# Patient Record
Sex: Male | Born: 1980 | Race: Black or African American | Hispanic: No | Smoking: Current some day smoker
Health system: Southern US, Community
[De-identification: ages and names within clinical notes are randomized; demographics above are authoritative.]

---

## 2020-12-30 ENCOUNTER — Ambulatory Visit (INDEPENDENT_AMBULATORY_CARE_PROVIDER_SITE_OTHER): Payer: Medicaid Other

## 2020-12-30 ENCOUNTER — Ambulatory Visit (HOSPITAL_COMMUNITY)
Admission: EM | Admit: 2020-12-30 | Discharge: 2020-12-30 | Disposition: A | Discharge status: Home / Self Care | Payer: Medicaid Other | Attending: Student | Admitting: Student

## 2020-12-30 ENCOUNTER — Encounter (HOSPITAL_COMMUNITY): Payer: Self-pay

## 2020-12-30 ENCOUNTER — Other Ambulatory Visit: Payer: Self-pay

## 2020-12-30 DIAGNOSIS — M25511 Pain in right shoulder: Secondary | ICD-10-CM | POA: Diagnosis not present

## 2020-12-30 DIAGNOSIS — S161XXA Strain of muscle, fascia and tendon at neck level, initial encounter: Secondary | ICD-10-CM

## 2020-12-30 DIAGNOSIS — S46911A Strain of unspecified muscle, fascia and tendon at shoulder and upper arm level, right arm, initial encounter: Secondary | ICD-10-CM

## 2020-12-30 DIAGNOSIS — G44209 Tension-type headache, unspecified, not intractable: Secondary | ICD-10-CM | POA: Diagnosis not present

## 2020-12-30 DIAGNOSIS — S134XXA Sprain of ligaments of cervical spine, initial encounter: Secondary | ICD-10-CM

## 2020-12-30 MED ORDER — NAPROXEN 500 MG PO TABS: 500.0000 mg | ORAL_TABLET | Freq: Two times a day (BID) | ORAL | 0 refills | Status: DC

## 2020-12-30 MED ORDER — TIZANIDINE HCL 2 MG PO CAPS: 2.0000 mg | ORAL_CAPSULE | Freq: Three times a day (TID) | ORAL | 0 refills | Status: DC
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
## 2020-12-30 NOTE — Discharge Instructions (Addendum)
For your neck pain, use the zanaflex and naproxen. Zanaflex (tizanidine) is a muscle relaxer that you can take up to 3x daily. This will help with muscular spasm and pain. It can make you drowsy so take when you don't have to drive or operate machinery. You can also take naproxen, up to 2x daily for pain and inflammation. Make sure to take this with food. You can also take tylenol up to 1000mg  3x daily. Make sure to avoid other NSAIDs like ibuprofen or alleve while taking naproxen.  For your shoulder- rest, ice/heat, shoulder sling as needed for pain. If your pain worsens or persists, call sports medicine doctor for follow-up. Information below.  For your headache- Get help right away (go to ED or call 911) if: You have: A very bad headache that is not helped by medicine. Trouble walking or weakness in your arms and legs. Clear or bloody fluid coming from your nose or ears. Changes in how you see (vision). A seizure. More confusion or more grumpy moods. Your symptoms get worse. You are sleepier than normal and have trouble staying awake. You lose your balance. The black centers of your eyes (pupils) change in size. Your speech is slurred. Your dizziness gets worse. You vomit.
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
## 2021-01-02 ENCOUNTER — Telehealth (HOSPITAL_COMMUNITY): Payer: Self-pay | Admitting: Emergency Medicine

## 2021-01-02 MED ORDER — TIZANIDINE HCL 2 MG PO CAPS: 2.0000 mg | ORAL_CAPSULE | Freq: Three times a day (TID) | ORAL | 0 refills | Status: DC

## 2021-01-02 NOTE — Telephone Encounter (Signed)
Prescription for TIzanidine is not covered by medicaid, patient asking for cheaper alternative.  Looked up Tizanidine on [facility] and transferring prescription to [facility]- patient verbalized understanding

## 2021-06-26 ENCOUNTER — Encounter (HOSPITAL_COMMUNITY): Payer: Self-pay

## 2021-06-26 ENCOUNTER — Ambulatory Visit (HOSPITAL_COMMUNITY)
Admission: EM | Admit: 2021-06-26 | Discharge: 2021-06-26 | Disposition: A | Discharge status: Home / Self Care | Payer: Medicaid Other | Attending: Emergency Medicine | Admitting: Emergency Medicine

## 2021-06-26 DIAGNOSIS — R42 Dizziness and giddiness: Secondary | ICD-10-CM | POA: Insufficient documentation

## 2021-06-26 DIAGNOSIS — Z20822 Contact with and (suspected) exposure to covid-19: Secondary | ICD-10-CM | POA: Diagnosis not present

## 2021-06-26 DIAGNOSIS — R519 Headache, unspecified: Secondary | ICD-10-CM | POA: Insufficient documentation

## 2021-06-26 DIAGNOSIS — F17210 Nicotine dependence, cigarettes, uncomplicated: Secondary | ICD-10-CM | POA: Diagnosis not present

## 2021-06-26 LAB — CBC
HCT: 42.5 % (ref 39.0–52.0)
Hemoglobin: 15.3 g/dL (ref 13.0–17.0)
MCH: 36.3 pg — ABNORMAL HIGH (ref 26.0–34.0)
MCHC: 36 g/dL (ref 30.0–36.0)
MCV: 100.7 fL — ABNORMAL HIGH (ref 80.0–100.0)
Platelets: 237 10*3/uL (ref 150–400)
RBC: 4.22 MIL/uL (ref 4.22–5.81)
RDW: 13.9 % (ref 11.5–15.5)
WBC: 3.6 10*3/uL — ABNORMAL LOW (ref 4.0–10.5)
nRBC: 0 % (ref 0.0–0.2)

## 2021-06-26 LAB — COMPREHENSIVE METABOLIC PANEL
ALT: 25 U/L (ref 0–44)
AST: 27 U/L (ref 15–41)
Albumin: 4 g/dL (ref 3.5–5.0)
Alkaline Phosphatase: 78 U/L (ref 38–126)
Anion gap: 5 (ref 5–15)
BUN: 12 mg/dL (ref 6–20)
CO2: 29 mmol/L (ref 22–32)
Calcium: 9.2 mg/dL (ref 8.9–10.3)
Chloride: 108 mmol/L (ref 98–111)
Creatinine, Ser: 1.08 mg/dL (ref 0.61–1.24)
GFR, Estimated: >60 mL/min (ref >60)
Glucose, Bld: 66 mg/dL — ABNORMAL LOW (ref 70–99)
Potassium: 4.3 mmol/L (ref 3.5–5.1)
Sodium: 142 mmol/L (ref 135–145)
Total Bilirubin: 0.9 mg/dL (ref 0.3–1.2)
Total Protein: 7 g/dL (ref 6.5–8.1)

## 2021-06-26 LAB — SARS CORONAVIRUS 2 (TAT 6-24 HRS): SARS Coronavirus 2: NEGATIVE

## 2021-06-26 LAB — TSH: TSH: 2.528 u[IU]/mL (ref 0.350–4.500)

## 2021-06-26 MED ORDER — MECLIZINE HCL 25 MG PO TABS
25.0000 mg | ORAL_TABLET | Freq: Three times a day (TID) | ORAL | 0 refills | Status: DC | PRN
Start: 2021-06-26 — End: 2023-09-24

## 2021-06-26 NOTE — Discharge Instructions (Addendum)
The cause of your symptoms are unknown at this time therefore lab work is being drawn to rule out causes  Lab work is pending 24 hours, you will be notified of any concerning values  Can use meclizine every 8 hours as needed for dizziness  Can use 600 to 800 mg of ibuprofen every 8 hours as needed to manage headaches  A primary care referral has been placed for you to help you establish care however you may find any doctor that you deem appropriate  Please measure blood pressure at least 2-3 times a week and record values to take to primary care appointment, if blood pressure is consistently elevated this will need to be managed by primary care physician in order to prevent long-term complications

## 2021-06-26 NOTE — ED Triage Notes (Signed)
Pt reports headache and room spinning when standing up x 1 week.

## 2021-06-26 NOTE — ED Provider Notes (Signed)
[facility]    CSN: [id number] Arrival date & time: 06/26/21  1035      History   Chief Complaint Chief Complaint  Patient presents with   Dizziness   Headache    HPI Justin Phelps is a 40 y.o. male.   Patient presents with dizziness and intermittent frontal headache for 1 week. Feels like he is moving, like he will faint or fall. Occurs with change of position and with sitting. Endorses adequate food and fluid intake. Denies fever, chills, congestion, chest pain or tightness, shortness of breath, intolerance to cold or heat, nausea, light sensitivity, blurred vision, floaters. Vaccinated. No pertinent medical history. Blood pressure elevated. Unsure if this is his baseline.   History reviewed. No pertinent past medical history.  There are no problems to display for this patient.   History reviewed. No pertinent surgical history.     Home Medications    Prior to Admission medications   Medication Sig Start Date End Date Taking? Authorizing Provider  meclizine (ANTIVERT) 25 MG tablet Take 1 tablet (25 mg total) by mouth 3 (three) times daily as needed for dizziness. 06/26/21  Yes Mariel Gaudin R, NP  naproxen (NAPROSYN) 500 MG tablet Take 1 tablet (500 mg total) by mouth 2 (two) times daily. 12/30/20   Rhys Martini, PA-C  tizanidine (ZANAFLEX) 2 MG capsule Take 1 capsule (2 mg total) by mouth 3 (three) times daily. 01/02/21   Lamptey, Britta Mccreedy, MD    Family History History reviewed. No pertinent family history.  Social History Social History   Tobacco Use   Smoking status: Some Days    Types: Cigarettes   Smokeless tobacco: Never  Substance Use Topics   Alcohol use: Yes   Drug use: Yes    Frequency: 3.0 times per week    Types: Marijuana     Allergies   Patient has no known allergies.   Review of Systems Review of Systems Defer to HPI  Physical Exam Triage Vital Signs ED Triage Vitals  Enc Vitals Group     BP 06/26/21 1127 (!) 147/100      Pulse Rate 06/26/21 1127 61     Resp 06/26/21 1127 18     Temp 06/26/21 1127 98.2 F (36.8 C)     Temp src --      SpO2 06/26/21 1127 100 %     Weight --      Height --      Head Circumference --      Peak Flow --      Pain Score 06/26/21 1125 7     Pain Loc --      Pain Edu? --      Excl. in GC? --    No data found.  Updated Vital Signs BP (!) 147/100 (BP Location: Right Arm)   Pulse 61   Temp 98.2 F (36.8 C)   Resp 18   SpO2 100%   Visual Acuity Right Eye Distance:   Left Eye Distance:   Bilateral Distance:    Right Eye Near:   Left Eye Near:    Bilateral Near:     Physical Exam Constitutional:      Appearance: Normal appearance. He is normal weight.  HENT:     Head: Normocephalic.     Right Ear: Tympanic membrane, ear canal and external ear normal.     Left Ear: Tympanic membrane, ear canal and external ear normal.     Nose: Nose normal.  Mouth/Throat:     Mouth: Mucous membranes are moist.     Pharynx: Oropharynx is clear.  Eyes:     Extraocular Movements: Extraocular movements intact.     Conjunctiva/sclera: Conjunctivae normal.     Pupils: Pupils are equal, round, and reactive to light.  Cardiovascular:     Rate and Rhythm: Normal rate and regular rhythm.     Pulses: Normal pulses.     Heart sounds: Normal heart sounds.  Pulmonary:     Effort: Pulmonary effort is normal.     Breath sounds: Normal breath sounds.  Musculoskeletal:        General: Normal range of motion.     Cervical back: Normal range of motion and neck supple.  Skin:    General: Skin is warm and dry.  Neurological:     General: No focal deficit present.     Mental Status: He is alert and oriented to person, place, and time. Mental status is at baseline.     Cranial Nerves: No cranial nerve deficit.     Sensory: No sensory deficit.     Motor: No weakness.     Coordination: Coordination normal.     Gait: Gait normal.  Psychiatric:        Mood and Affect: Mood normal.         Behavior: Behavior normal.     UC Treatments / Results  Labs (all labs ordered are listed, but only abnormal results are displayed) Labs Reviewed  SARS CORONAVIRUS 2 (TAT 6-24 HRS)  COMPREHENSIVE METABOLIC PANEL  CBC  TSH    EKG   Radiology No results found.  Procedures Procedures (including critical care time)  Medications Ordered in UC Medications - No data to display  Initial Impression / Assessment and Plan / UC Course  I have reviewed the triage vital signs and the nursing notes.  Pertinent labs & imaging results that were available during my care of the patient were reviewed by me and considered in my medical decision making (see chart for details).  Dizziness  Bad headache  Patient vital signs stable, neurological exam unremarkable, no signs of distress at this time, unknown etiology of symptoms, will move forward with conservative treatment attempting to rule out causes with advised  follow-up with primary care physician  CBC, TSH, CMP- pending Covid test pending PCP referral placed Advised to monitor blood pressure 2-3 times a week and record until seen by primary care doctor to establish baseline Meclizine 25 mg 3 times a day as needed Advised use of over-the-counter Tylenol and ibuprofen for headache management 7.  Patient given strict return precautions for worsening symptoms to return to urgent care for evaluation of unable to establish care Final Clinical Impressions(s) / UC Diagnoses   Final diagnoses:  Dizziness  Bad headache     Discharge Instructions      The cause of your symptoms are unknown at this time therefore lab work is being drawn to rule out causes  Lab work is pending 24 hours, you will be notified of any concerning values  Can use meclizine every 8 hours as needed for dizziness  Can use 600 to 800 mg of ibuprofen every 8 hours as needed to manage headaches  A primary care referral has been placed for you to help you  establish care however you may find any doctor that you deem appropriate  Please measure blood pressure at least 2-3 times a week and record values to take to primary care  appointment, if blood pressure is consistently elevated this will need to be managed by primary care physician in order to prevent long-term complications   ED Prescriptions     Medication Sig Dispense Auth. Provider   meclizine (ANTIVERT) 25 MG tablet Take 1 tablet (25 mg total) by mouth 3 (three) times daily as needed for dizziness. 30 tablet Valinda Hoar, NP      PDMP not reviewed this encounter.   Valinda Hoar, NP 06/26/21 1243

## 2022-05-07 ENCOUNTER — Other Ambulatory Visit: Payer: Self-pay

## 2022-05-07 ENCOUNTER — Ambulatory Visit
Admission: RE | Admit: 2022-05-07 | Discharge: 2022-05-07 | Disposition: A | Discharge status: Home / Self Care | Payer: Medicaid Other | Source: Ambulatory Visit | Attending: Physician Assistant | Admitting: Physician Assistant

## 2022-05-07 VITALS — BP 116/72 | HR 61 | Temp 97.6°F | Resp 18

## 2022-05-07 DIAGNOSIS — J0120 Acute ethmoidal sinusitis, unspecified: Secondary | ICD-10-CM

## 2022-05-07 MED ORDER — AMOXICILLIN 500 MG PO CAPS: 500.0000 mg | ORAL_CAPSULE | Freq: Three times a day (TID) | ORAL | 0 refills | Status: DC

## 2022-05-07 NOTE — ED Triage Notes (Addendum)
Headaches started one week ago.  Intermittent chills, slight runny nose and cough.  Head pain increases with bending forward and has some dizziness

## 2022-05-07 NOTE — Discharge Instructions (Addendum)
Return if any problems.

## 2022-05-12 NOTE — ED Provider Notes (Signed)
[facility]    CSN: [id number] Arrival date & time: 05/07/22  1200      History   Chief Complaint Chief Complaint  Patient presents with   Chills    Throbbing headache that wont go away  been like this for about a week now. Almost like a sinus headache. When i bend down i get real dizzy and light headed and my job requires me too do a lot of bending and squatting throughout the day. Im not really - Entered by patient   Appointment    12:00    HPI Justin Phelps is a 41 y.o. male.   Patient complains of a fever and chills patient complains of sinus pressure and congestion patient reports headache.  Patient is concerned that he may have a sinus infection.  Patient denies any shortness of breath  The history is provided by the patient. No language interpreter was used.    History reviewed. No pertinent past medical history.  There are no problems to display for this patient.   History reviewed. No pertinent surgical history.     Home Medications    Prior to Admission medications   Medication Sig Start Date End Date Taking? Authorizing Provider  amoxicillin (AMOXIL) 500 MG capsule Take 1 capsule (500 mg total) by mouth 3 (three) times daily. 05/07/22  Yes Elson Areas, PA-C  meclizine (ANTIVERT) 25 MG tablet Take 1 tablet (25 mg total) by mouth 3 (three) times daily as needed for dizziness. Patient not taking: Reported on 05/07/2022 06/26/21   Valinda Hoar, NP  naproxen (NAPROSYN) 500 MG tablet Take 1 tablet (500 mg total) by mouth 2 (two) times daily. Patient not taking: Reported on 05/07/2022 12/30/20   Rhys Martini, PA-C  tizanidine (ZANAFLEX) 2 MG capsule Take 1 capsule (2 mg total) by mouth 3 (three) times daily. Patient not taking: Reported on 05/07/2022 01/02/21   Merrilee Jansky, MD    Family History History reviewed. No pertinent family history.  Social History Social History   Tobacco Use   Smoking status: Some Days    Types:  Cigarettes   Smokeless tobacco: Never  Vaping Use   Vaping Use: Never used  Substance Use Topics   Alcohol use: Yes   Drug use: Yes    Frequency: 3.0 times per week    Types: Marijuana     Allergies   Patient has no known allergies.   Review of Systems Review of Systems  HENT:  Positive for congestion, facial swelling and sinus pain.   All other systems reviewed and are negative.    Physical Exam Triage Vital Signs ED Triage Vitals  Enc Vitals Group     BP 05/07/22 1213 116/72     Pulse Rate 05/07/22 1213 61     Resp 05/07/22 1213 18     Temp 05/07/22 1213 97.6 F (36.4 C)     Temp Source 05/07/22 1213 Oral     SpO2 05/07/22 1213 98 %     Weight --      Height --      Head Circumference --      Peak Flow --      Pain Score 05/07/22 1210 5     Pain Loc --      Pain Edu? --      Excl. in [place]? --    No data found.  Updated Vital Signs BP 116/72 (BP Location: Left Arm)   Pulse 61  Temp 97.6 F (36.4 C) (Oral)   Resp 18   SpO2 98%   Visual Acuity Right Eye Distance:   Left Eye Distance:   Bilateral Distance:    Right Eye Near:   Left Eye Near:    Bilateral Near:     Physical Exam Vitals and nursing note reviewed.  Constitutional:      Appearance: He is well-developed.  HENT:     Head: Normocephalic.  Cardiovascular:     Rate and Rhythm: Normal rate.  Pulmonary:     Effort: Pulmonary effort is normal.  Abdominal:     General: There is no distension.  Musculoskeletal:        General: Normal range of motion.     Cervical back: Normal range of motion.  Skin:    General: Skin is warm.  Neurological:     General: No focal deficit present.     Mental Status: He is alert and oriented to person, place, and time.  Psychiatric:        Mood and Affect: Mood normal.      UC Treatments / Results  Labs (all labs ordered are listed, but only abnormal results are displayed) Labs Reviewed - No data to display  EKG   Radiology No results  found.  Procedures Procedures (including critical care time)  Medications Ordered in UC Medications - No data to display  Initial Impression / Assessment and Plan / UC Course  I have reviewed the triage vital signs and the nursing notes.  Pertinent labs & imaging results that were available during my care of the patient were reviewed by me and considered in my medical decision making (see chart for details).     MDM: Counseled on sinusitis and a prescription for amoxicillin he is advised to follow-up with his primary care physician Final Clinical Impressions(s) / UC Diagnoses   Final diagnoses:  Acute ethmoidal sinusitis, recurrence not specified     Discharge Instructions      Return if any problems.    ED Prescriptions     Medication Sig Dispense Auth. Provider   amoxicillin (AMOXIL) 500 MG capsule Take 1 capsule (500 mg total) by mouth 3 (three) times daily. 30 capsule Elson Areas, [place]      PDMP not reviewed this encounter.   Elson Areas, [place] 05/12/22 2121

## 2022-07-01 ENCOUNTER — Ambulatory Visit
Admission: RE | Admit: 2022-07-01 | Discharge: 2022-07-01 | Disposition: A | Discharge status: Home / Self Care | Payer: Medicaid Other | Source: Ambulatory Visit | Attending: Emergency Medicine | Admitting: Emergency Medicine

## 2022-07-01 VITALS — BP 132/86 | HR 63 | Temp 98.3°F | Resp 16

## 2022-07-01 DIAGNOSIS — H6122 Impacted cerumen, left ear: Secondary | ICD-10-CM | POA: Diagnosis present

## 2022-07-01 DIAGNOSIS — J0100 Acute maxillary sinusitis, unspecified: Secondary | ICD-10-CM | POA: Diagnosis present

## 2022-07-01 DIAGNOSIS — Z20822 Contact with and (suspected) exposure to covid-19: Secondary | ICD-10-CM

## 2022-07-01 LAB — SARS CORONAVIRUS 2 BY RT PCR: SARS Coronavirus 2 by RT PCR: NEGATIVE

## 2022-07-01 MED ORDER — MOLNUPIRAVIR EUA 200MG CAPSULE
4.0000 | ORAL_CAPSULE | Freq: Two times a day (BID) | ORAL | 0 refills | Status: AC
Start: 2022-07-01 — End: 2022-07-06

## 2022-07-01 MED ORDER — FLUTICASONE PROPIONATE 50 MCG/ACT NA SUSP
2.0000 | Freq: Every day | NASAL | 0 refills | Status: DC
Start: 2022-07-01 — End: 2023-08-22

## 2022-07-01 NOTE — Discharge Instructions (Addendum)
Discontinue the molnupiravir if your COVID is negative.  Start Mucinex to keep the mucous thin, Flonase to help you breathe easier.   You may take 600 mg of motrin with 1000 mg of tylenol up to 3-4 times a day as needed for pain. This is an effective combination for pain.  Most sinus infections are viral and do not need antibiotics unless you have a high fever, have had this for 10 days, or you get better and then get sick again. Use a NeilMed sinus rinse with distilled water as often as you want to to reduce nasal congestion. Follow the directions on the box.   Go to www.goodrx.com to look up your medications. This will give you a list of where you can find your prescriptions at the most affordable prices. Or you can ask the pharmacist what the cash price is. This is frequently cheaper than going through insurance.

## 2022-07-01 NOTE — ED Triage Notes (Signed)
Pt. States that since Thursday he has been experiencing headaches, bodyaches and dizziness when he bends down.

## 2022-07-01 NOTE — ED Provider Notes (Signed)
HPI  SUBJECTIVE:  Justin Phelps is a 41 y.o. male who presents with 4 to 5 days of headache, body aches, sinus pressure, dizziness described as feeling off balance and lightheaded that is worse with bending over.  He reports some mild photophobia.  No ear pain, fevers, nasal congestion, rhinorrhea, sinus pain, sore throat, postnasal drip, loss of sense of smell or taste, cough, wheeze, shortness of breath, neck stiffness, rash, nausea, vomiting, diarrhea, abdominal pain.  No known COVID or flu exposure.  He got 1 dose of the COVID-vaccine.  He has had episodes of dizziness when he gets up from a sitting/squatting position rapidly for the past year.  He works in a hot environment, but states that he is eating and drinking well.  He has tried Excedrin with improvement in the symptoms.  Symptoms are worse with standing up rapidly and exposure to bright light.  He had a headache like this 2 months ago, and was found to have sinusitis.  He has a past medical history of headache.  No other medical problems.    History reviewed. No pertinent past medical history.  History reviewed. No pertinent surgical history.  History reviewed. No pertinent family history.  Social History   Tobacco Use   Smoking status: Some Days    Types: Cigarettes   Smokeless tobacco: Never  Vaping Use   Vaping Use: Never used  Substance Use Topics   Alcohol use: Yes   Drug use: Yes    Frequency: 3.0 times per week    Types: Marijuana    No current facility-administered medications for this encounter.  Current Outpatient Medications:    fluticasone (FLONASE) 50 MCG/ACT nasal spray, Place 2 sprays into both nostrils daily., Disp: 16 g, Rfl: 0   molnupiravir EUA (LAGEVRIO) 200 mg CAPS capsule, Take 4 capsules (800 mg total) by mouth 2 (two) times daily for 5 days., Disp: 40 capsule, Rfl: 0   amoxicillin (AMOXIL) 500 MG capsule, Take 1 capsule (500 mg total) by mouth 3 (three) times daily., Disp: 30 capsule, Rfl: 0    meclizine (ANTIVERT) 25 MG tablet, Take 1 tablet (25 mg total) by mouth 3 (three) times daily as needed for dizziness. (Patient not taking: Reported on 05/07/2022), Disp: 30 tablet, Rfl: 0   naproxen (NAPROSYN) 500 MG tablet, Take 1 tablet (500 mg total) by mouth 2 (two) times daily. (Patient not taking: Reported on 05/07/2022), Disp: 30 tablet, Rfl: 0   tizanidine (ZANAFLEX) 2 MG capsule, Take 1 capsule (2 mg total) by mouth 3 (three) times daily. (Patient not taking: Reported on 05/07/2022), Disp: 21 capsule, Rfl: 0  No Known Allergies   ROS  As noted in HPI.   Physical Exam  BP 132/86   Pulse 63   Temp 98.3 F (36.8 C)   Resp 16   SpO2 97%   Constitutional: Well developed, well nourished, no acute distress Eyes:  EOMI, conjunctiva normal bilaterally HENT: Normocephalic, atraumatic,mucus membranes moist.  Positive nasal congestion, maxillary sinus tenderness, erythematous, swollen turbinates.  Left TM obscured with cerumen.  Right TM normal. Neck: Positive shotty cervical lymphadenopathy Respiratory: Normal inspiratory effort Cardiovascular: Normal rate GI: nondistended skin: No rash, skin intact Musculoskeletal: no deformities Neurologic: Alert & oriented x 3, no focal neuro deficits Psychiatric: Speech and behavior appropriate   ED Course   Medications - No data to display  Orders Placed This Encounter  Procedures   SARS Coronavirus 2 by RT PCR (hospital order, performed in [facility] hospital lab) *cepheid  single result test* Anterior Nasal Swab    Standing Status:   Standing    Number of Occurrences:   1   Ear wax removal    Left ear only    Standing Status:   Standing    Number of Occurrences:   1    Results for orders placed or performed during the hospital encounter of 07/01/22 (from the past 24 hour(s))  SARS Coronavirus 2 by RT PCR (hospital order, performed in [facility] hospital lab) *cepheid single result test* Anterior Nasal Swab     Status: None    Collection Time: 07/01/22 12:17 PM   Specimen: Anterior Nasal Swab  Result Value Ref Range   SARS Coronavirus 2 by RT PCR NEGATIVE NEGATIVE   No results found.  ED Clinical Impression  1. Acute non-recurrent maxillary sinusitis   2. Encounter for laboratory testing for COVID-19 virus   3. Impacted cerumen of left ear      ED Assessment/Plan     1.  Viral illness.  Concern for COVID.  Today would be the last window to start him on antivirals.  He qualifies for antivirals due to partially vaccinated status.  Home with molnupiravir.  He will discontinue if his COVID is negative.  If his flu is positive, unfortunately, he is out of the treatment window for this.  He does have some maxillary sinus tenderness suggestive of an early sinusitis, however he does not meet criteria for antibiotics.  We will send him home with saline nasal irrigation, Flonase, Mucinex.  Return here in 5 days if the nasal congestion, sinus pressure is not any better , and we can consider antibiotics at that time.  COVID-negative.  He is to discontinue the molnupiravir.  Patient does not have MyChart.  Will have staff contact patient and notify him of negative result and that he needs to stop the molnupiravir and treat supportively.  2.  Dizziness.  He works in a hot environment.  Suspect that he has some dehydration versus lightheadedness from vasodilation as standing up rapidly.  Encouraged him to push electrolyte containing fluids.  He also has cerumen impaction in his left ear which could be contributing to this.   will have this irrigated and reevaluate.  On reevaluation, patient states that he feels better.  Left TM intact, normal.  Discussed labs, MDM, treatment plan, and plan for follow-up with patient. patient agrees with plan.   Meds ordered this encounter  Medications   molnupiravir EUA (LAGEVRIO) 200 mg CAPS capsule    Sig: Take 4 capsules (800 mg total) by mouth 2 (two) times daily for 5 days.     Dispense:  40 capsule    Refill:  0   fluticasone (FLONASE) 50 MCG/ACT nasal spray    Sig: Place 2 sprays into both nostrils daily.    Dispense:  16 g    Refill:  0      *This clinic note was created using Scientist, clinical (histocompatibility and immunogenetics). Therefore, there may be occasional mistakes despite careful proofreading.  ?    Domenick Gong, MD 07/02/22 [phone]

## 2022-07-26 ENCOUNTER — Ambulatory Visit
Admission: RE | Admit: 2022-07-26 | Discharge: 2022-07-26 | Disposition: A | Discharge status: Home / Self Care | Payer: Medicaid Other | Source: Ambulatory Visit | Attending: Internal Medicine | Admitting: Internal Medicine

## 2022-07-26 VITALS — BP 119/80 | HR 65 | Temp 97.7°F | Resp 16

## 2022-07-26 DIAGNOSIS — G43019 Migraine without aura, intractable, without status migrainosus: Secondary | ICD-10-CM | POA: Diagnosis not present

## 2022-07-26 MED ORDER — DEXAMETHASONE SODIUM PHOSPHATE 10 MG/ML IJ SOLN
10.0000 mg | Freq: Once | INTRAMUSCULAR | Status: AC
  Administered 2022-07-26: 10 mg via INTRAMUSCULAR

## 2022-07-26 MED ORDER — KETOROLAC TROMETHAMINE 30 MG/ML IJ SOLN
30.0000 mg | Freq: Once | INTRAMUSCULAR | Status: AC
  Administered 2022-07-26: 30 mg via INTRAMUSCULAR

## 2022-07-26 NOTE — Discharge Instructions (Signed)
You were given 2 shots in urgent care today to help decrease inflammation and pain.  Please go to the emergency department if symptoms persist or worsen.  Also follow-up with headache clinic for further evaluation management of possible migraines.

## 2022-07-26 NOTE — ED Provider Notes (Addendum)
[facility]    CSN: [id number] Arrival date & time: 07/26/22  1215      History   Chief Complaint Chief Complaint  Patient presents with   Headache    HPI Justin Phelps is a 41 y.o. male.   Patient presents with 3-day history of intermittent headaches.  Patient reports that headache is present in the center of her forehead and also in the occipital portion of the head.  They are intermittent.  He currently has a headache that he rates 8/10 on pain scale.  He reports that it is not the worst headache of his life.  Denies nasal congestion, cough, runny nose, nausea, vomiting, diarrhea, abdominal pain, fever.  Denies any known sick contacts.  Patient reports that headache worsens when he bends over.  He also reports some intermittent dizziness.  Denies any blurred vision.  Patient has been seen multiple times for similar headaches and was thought to have a sinus infection.  He reports that symptoms sometimes improve with medications that he has been prescribed for sinus infections.  Denies any recent falls or head trauma.  Patient denies any obvious sinus pressure but states that the headache feels like "sinus pressure" in the front of his head.   Headache   History reviewed. No pertinent past medical history.  There are no problems to display for this patient.   History reviewed. No pertinent surgical history.     Home Medications    Prior to Admission medications   Medication Sig Start Date End Date Taking? Authorizing Provider  amoxicillin (AMOXIL) 500 MG capsule Take 1 capsule (500 mg total) by mouth 3 (three) times daily. 05/07/22   Fransico Meadow, PA-C  fluticasone (FLONASE) 50 MCG/ACT nasal spray Place 2 sprays into both nostrils daily. 07/01/22   Melynda Ripple, MD  meclizine (ANTIVERT) 25 MG tablet Take 1 tablet (25 mg total) by mouth 3 (three) times daily as needed for dizziness. Patient not taking: Reported on 05/07/2022 06/26/21   Hans Eden, NP   naproxen (NAPROSYN) 500 MG tablet Take 1 tablet (500 mg total) by mouth 2 (two) times daily. Patient not taking: Reported on 05/07/2022 12/30/20   Hazel Sams, PA-C  tizanidine (ZANAFLEX) 2 MG capsule Take 1 capsule (2 mg total) by mouth 3 (three) times daily. Patient not taking: Reported on 05/07/2022 01/02/21   Chase Picket, MD    Family History History reviewed. No pertinent family history.  Social History Social History   Tobacco Use   Smoking status: Some Days    Types: Cigarettes   Smokeless tobacco: Never  Vaping Use   Vaping Use: Never used  Substance Use Topics   Alcohol use: Yes   Drug use: Yes    Frequency: 3.0 times per week    Types: Marijuana     Allergies   Patient has no known allergies.   Review of Systems Review of Systems Per HPI  Physical Exam Triage Vital Signs ED Triage Vitals  Enc Vitals Group     BP 07/26/22 1222 119/80     Pulse Rate 07/26/22 1222 65     Resp 07/26/22 1222 16     Temp 07/26/22 1222 97.7 F (36.5 C)     Temp Source 07/26/22 1222 Oral     SpO2 07/26/22 1222 96 %     Weight --      Height --      Head Circumference --      Peak Flow --  Pain Score 07/26/22 1228 8     Pain Loc --      Pain Edu? --      Excl. in [place]? --    No data found.  Updated Vital Signs BP 119/80 (BP Location: Left Arm)   Pulse 65   Temp 97.7 F (36.5 C) (Oral)   Resp 16   SpO2 96%   Visual Acuity Right Eye Distance:   Left Eye Distance:   Bilateral Distance:    Right Eye Near:   Left Eye Near:    Bilateral Near:     Physical Exam Constitutional:      General: He is not in acute distress.    Appearance: Normal appearance. He is not toxic-appearing or diaphoretic.  HENT:     Head: Normocephalic and atraumatic.     Right Ear: Tympanic membrane and ear canal normal.     Left Ear: Tympanic membrane and ear canal normal.     Nose: Nose normal.     Mouth/Throat:     Mouth: Mucous membranes are moist.     Pharynx: No  posterior oropharyngeal erythema.  Eyes:     Extraocular Movements: Extraocular movements intact.     Conjunctiva/sclera: Conjunctivae normal.     Pupils: Pupils are equal, round, and reactive to light.  Cardiovascular:     Rate and Rhythm: Normal rate and regular rhythm.     Pulses: Normal pulses.     Heart sounds: Normal heart sounds.  Pulmonary:     Effort: Pulmonary effort is normal. No respiratory distress.     Breath sounds: Normal breath sounds.  Abdominal:     General: Bowel sounds are normal. There is no distension.     Palpations: Abdomen is soft.     Tenderness: There is no abdominal tenderness.  Neurological:     General: No focal deficit present.     Mental Status: He is alert and oriented to person, place, and time. Mental status is at baseline.     Cranial Nerves: Cranial nerves 2-12 are intact.     Sensory: Sensation is intact.     Motor: Motor function is intact.     Coordination: Coordination is intact.     Gait: Gait is intact.  Psychiatric:        Mood and Affect: Mood normal.        Behavior: Behavior normal.        Thought Content: Thought content normal.        Judgment: Judgment normal.      UC Treatments / Results  Labs (all labs ordered are listed, but only abnormal results are displayed) Labs Reviewed - No data to display  EKG   Radiology No results found.  Procedures Procedures (including critical care time)  Medications Ordered in UC Medications  ketorolac (TORADOL) 30 MG/ML injection 30 mg (30 mg Intramuscular Given 07/26/22 1247)  dexamethasone (DECADRON) injection 10 mg (10 mg Intramuscular Given 07/26/22 1248)    Initial Impression / Assessment and Plan / UC Course  I have reviewed the triage vital signs and the nursing notes.  Pertinent labs & imaging results that were available during my care of the patient were reviewed by me and considered in my medical decision making (see chart for details).     Physical exam is fairly  benign.  Neuro exam is normal.  Differential diagnoses include sinus infection versus chronic migraine.  I am suspicious that patient's recent visits to the urgent care could be  from migraine given description of symptoms.  Although, patient could have viral sinusitis causing headache as well.  Will attempt to treat for both with IM Toradol and IM Decadron here in urgent care which should help alleviate headache and inflammation associated with sinus pressure/sinusitis.  Do not think any antibiotics are warranted given duration of symptoms and no signs of secondary bacterial infection on exam.  Patient was given headache clinic information to seek care for further evaluation and management of possible migraines.  Given that neuro exam is normal and patient has had similar symptoms in the past, do not think that emergent evaluation or imaging of the head is necessary at this time.  He was also advised to go to the emergency department if headache does not improve or if it worsens in the next 24 to 48 hours.  Patient verbalized understanding and was agreeable with plan. Final Clinical Impressions(s) / UC Diagnoses   Final diagnoses:  Intractable migraine without aura and without status migrainosus     Discharge Instructions      You were given 2 shots in urgent care today to help decrease inflammation and pain.  Please go to the emergency department if symptoms persist or worsen.  Also follow-up with headache clinic for further evaluation management of possible migraines.    ED Prescriptions   None    PDMP not reviewed this encounter.   Teodora Medici, [place] 07/26/22 [place], [place],  07/26/22 1333

## 2022-07-26 NOTE — ED Triage Notes (Signed)
3 days ago, began having headaches. Waxes and wanes in nature. Reports the headaches being in both the front and the back of his head. Cough, sneezing, bending over makes the headache worse. Light also makes the headaches worse. Denies nausea, vomiting, fever. Says he felt this way around 2 months ago, was told he had a sinus infection and believes he was prescribed an antibiotic. States whatever he took it did ease the headaches, but they never fully went away. Also reports occasional mild dizziness, denies loss of conscious or head trauma.

## 2022-08-23 ENCOUNTER — Ambulatory Visit (HOSPITAL_COMMUNITY)
Admission: RE | Admit: 2022-08-23 | Discharge: 2022-08-23 | Disposition: A | Discharge status: Home / Self Care | Payer: Medicaid Other | Source: Ambulatory Visit | Attending: Physician Assistant | Admitting: Physician Assistant

## 2022-08-23 ENCOUNTER — Encounter (HOSPITAL_COMMUNITY): Payer: Self-pay

## 2022-08-23 VITALS — BP 142/92 | HR 64 | Temp 98.7°F | Resp 16

## 2022-08-23 DIAGNOSIS — Z8616 Personal history of COVID-19: Secondary | ICD-10-CM | POA: Insufficient documentation

## 2022-08-23 DIAGNOSIS — R6883 Chills (without fever): Secondary | ICD-10-CM | POA: Diagnosis not present

## 2022-08-23 DIAGNOSIS — M545 Low back pain, unspecified: Secondary | ICD-10-CM | POA: Insufficient documentation

## 2022-08-23 DIAGNOSIS — R52 Pain, unspecified: Secondary | ICD-10-CM

## 2022-08-23 DIAGNOSIS — B349 Viral infection, unspecified: Secondary | ICD-10-CM | POA: Insufficient documentation

## 2022-08-23 DIAGNOSIS — F17210 Nicotine dependence, cigarettes, uncomplicated: Secondary | ICD-10-CM | POA: Diagnosis not present

## 2022-08-23 DIAGNOSIS — Z20822 Contact with and (suspected) exposure to covid-19: Secondary | ICD-10-CM | POA: Insufficient documentation

## 2022-08-23 LAB — POCT RAPID STREP A, ED / UC: Streptococcus, Group A Screen (Direct): NEGATIVE

## 2022-08-23 LAB — RESP PANEL BY RT-PCR (FLU A&B, COVID) ARPGX2
Influenza A by PCR: NEGATIVE
Influenza B by PCR: NEGATIVE
SARS Coronavirus 2 by RT PCR: NEGATIVE

## 2022-08-23 MED ORDER — NAPROXEN 500 MG PO TABS: 500.0000 mg | ORAL_TABLET | Freq: Two times a day (BID) | ORAL | 0 refills | Status: DC

## 2022-08-23 NOTE — ED Triage Notes (Signed)
Pt is here for back pain x4days

## 2022-08-23 NOTE — Discharge Instructions (Addendum)
Your strep test was negative.  We will contact you if you are positive for COVID or flu.  Use Naprosyn for body pain.  Do not take NSAIDs with this medication including aspirin, ibuprofen/Advil, naproxen/Aleve.  You can use Tylenol/acetaminophen as needed.  Make sure you are resting and drinking plenty of fluid.  If you develop any additional symptoms please return for reevaluation.  If you have any worsening symptoms including high fever not responding to medication, weakness, nausea/vomiting, cough, chest pain, shortness of breath you need to go to the emergency room.

## 2022-08-23 NOTE — ED Provider Notes (Signed)
[facility]    CSN: [id number] Arrival date & time: 08/23/22  1205      History   Chief Complaint Chief Complaint  Patient presents with   Back Pain    Entered by patient    HPI Justin Phelps is a 41 y.o. male.   Patient presents today with a several day history of widespread body aches.  Reports body ache pain is rated 8 on a 0-10 pain scale, generalized throughout his body but worse in his lower back, described as intense aching, no aggravating or alleviating factors notified.  He has tried Tylenol as well as Goody powders without improvement of symptoms.  Denies any known sick contacts but did travel a few weeks ago.  Reports occasional congestion but denies any significant symptoms.  Denies any sore throat, fever, nausea, vomiting, chest pain, shortness of breath, cough.  Denies any recent antibiotic or steroid use.  He is a smoker but denies any significant past medical history including COPD, asthma, diabetes, immunosuppression.  He has had COVID with last episode approximately 1.5 years ago.    History reviewed. No pertinent past medical history.  There are no problems to display for this patient.   History reviewed. No pertinent surgical history.     Home Medications    Prior to Admission medications   Medication Sig Start Date End Date Taking? Authorizing Provider  fluticasone (FLONASE) 50 MCG/ACT nasal spray Place 2 sprays into both nostrils daily. 07/01/22   Domenick Gong, MD  meclizine (ANTIVERT) 25 MG tablet Take 1 tablet (25 mg total) by mouth 3 (three) times daily as needed for dizziness. Patient not taking: Reported on 05/07/2022 06/26/21   Valinda Hoar, NP  naproxen (NAPROSYN) 500 MG tablet Take 1 tablet (500 mg total) by mouth 2 (two) times daily. 08/23/22   Jyl Chico, Noberto Retort, PA-C    Family History History reviewed. No pertinent family history.  Social History Social History   Tobacco Use   Smoking status: Some Days    Types:  Cigarettes   Smokeless tobacco: Never  Vaping Use   Vaping Use: Never used  Substance Use Topics   Alcohol use: Yes   Drug use: Yes    Frequency: 3.0 times per week    Types: Marijuana     Allergies   Patient has no known allergies.   Review of Systems Review of Systems  Constitutional:  Positive for activity change, chills and fatigue. Negative for appetite change and fever.  HENT:  Positive for congestion. Negative for sinus pressure, sneezing and sore throat.   Respiratory:  Negative for cough and shortness of breath.   Cardiovascular:  Negative for chest pain.  Gastrointestinal:  Negative for abdominal pain, diarrhea, nausea and vomiting.  Musculoskeletal:  Positive for arthralgias, back pain and myalgias.  Neurological:  Positive for headaches. Negative for dizziness and light-headedness.     Physical Exam Triage Vital Signs ED Triage Vitals  Enc Vitals Group     BP 08/23/22 1226 (!) 142/92     Pulse Rate 08/23/22 1226 64     Resp 08/23/22 1226 16     Temp 08/23/22 1226 98.7 F (37.1 C)     Temp Source 08/23/22 1226 Oral     SpO2 --      Weight --      Height --      Head Circumference --      Peak Flow --      Pain Score 08/23/22 1225 8  Pain Loc --      Pain Edu? --      Excl. in GC? --    No data found.  Updated Vital Signs BP (!) 142/92 (BP Location: Left Arm)   Pulse 64   Temp 98.7 F (37.1 C) (Oral)   Resp 16   Visual Acuity Right Eye Distance:   Left Eye Distance:   Bilateral Distance:    Right Eye Near:   Left Eye Near:    Bilateral Near:     Physical Exam Vitals reviewed.  Constitutional:      General: He is awake.     Appearance: Normal appearance. He is well-developed. He is not ill-appearing.     Comments: Very pleasant male appears stated age in no acute distress sitting comfortably in exam room  HENT:     Head: Normocephalic and atraumatic.     Right Ear: Tympanic membrane, ear canal and external ear normal. Tympanic  membrane is not erythematous or bulging.     Left Ear: Tympanic membrane, ear canal and external ear normal. Tympanic membrane is not erythematous or bulging.     Nose: Nose normal.     Mouth/Throat:     Pharynx: Uvula midline. Posterior oropharyngeal erythema present. No oropharyngeal exudate.  Cardiovascular:     Rate and Rhythm: Normal rate and regular rhythm.     Heart sounds: Normal heart sounds, S1 normal and S2 normal. No murmur heard. Pulmonary:     Effort: Pulmonary effort is normal. No accessory muscle usage or respiratory distress.     Breath sounds: Normal breath sounds. No stridor. No wheezing, rhonchi or rales.     Comments: Clear to auscultation bilaterally Abdominal:     General: Bowel sounds are normal.     Palpations: Abdomen is soft.     Tenderness: There is no abdominal tenderness.  Lymphadenopathy:     Head:     Right side of head: No submental, submandibular or tonsillar adenopathy.     Left side of head: No submental, submandibular or tonsillar adenopathy.     Cervical: No cervical adenopathy.  Neurological:     Mental Status: He is alert.  Psychiatric:        Behavior: Behavior is cooperative.      UC Treatments / Results  Labs (all labs ordered are listed, but only abnormal results are displayed) Labs Reviewed  CULTURE, GROUP A STREP ([facility])  RESP PANEL BY RT-PCR (FLU A&B, COVID) [id number]  POCT RAPID STREP A, ED / UC    EKG   Radiology No results found.  Procedures Procedures (including critical care time)  Medications Ordered in UC Medications - No data to display  Initial Impression / Assessment and Plan / UC Course  I have reviewed the triage vital signs and the nursing notes.  Pertinent labs & imaging results that were available during my care of the patient were reviewed by me and considered in my medical decision making (see chart for details).     Discussed likely viral etiology of symptoms.  Patient is well-appearing, afebrile,  nontoxic, nontachycardic.  No evidence of acute infection on physical exam.  Strep testing was obtained given erythema in throat with clinical presentation and was negative.  Will defer antibiotics until culture results are available.  Flu/COVID testing was obtained-results pending.  Patient is outside the window of effectiveness for Tamiflu and has no significant past medical history that would warrant Paxlovid or other antivirals for COVID.  Recommended he use over-the-counter  medications.  He was prescribed Naprosyn for body pain with instructions to take NSAIDs with this medication.  He can use acetaminophen/Tylenol for pain relief.  Discussed that he is to rest and drink plenty of fluid.  If he has any worsening symptoms he needs to be seen immediately.  If his symptoms no improvement next week he is to return or see primary care.  Discussed that if he has chest pain, shortness of breath, nausea/vomiting, weakness, severe cough, fever not respond to medication he should be seen immediately.  Work excuse note with current CDC return to work guidelines based on COVID test result provided during visit today.  Final Clinical Impressions(s) / UC Diagnoses   Final diagnoses:  Viral illness  Body aches  Chills     Discharge Instructions      Your strep test was negative.  We will contact you if you are positive for COVID or flu.  Use Naprosyn for body pain.  Do not take NSAIDs with this medication including aspirin, ibuprofen/Advil, naproxen/Aleve.  You can use Tylenol/acetaminophen as needed.  Make sure you are resting and drinking plenty of fluid.  If you develop any additional symptoms please return for reevaluation.  If you have any worsening symptoms including high fever not responding to medication, weakness, nausea/vomiting, cough, chest pain, shortness of breath you need to go to the emergency room.     ED Prescriptions     Medication Sig Dispense Auth. Provider   naproxen (NAPROSYN) 500  MG tablet Take 1 tablet (500 mg total) by mouth 2 (two) times daily. 30 tablet Vadhir Mcnay, Derry Skill, PA-C      PDMP not reviewed this encounter.   Terrilee Croak, PA-C 08/23/22 1314

## 2022-08-26 LAB — CULTURE, GROUP A STREP (THRC)
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
## 2022-09-19 ENCOUNTER — Ambulatory Visit: Payer: Self-pay

## 2022-09-20 ENCOUNTER — Ambulatory Visit (HOSPITAL_COMMUNITY)
Admission: RE | Admit: 2022-09-20 | Discharge: 2022-09-20 | Disposition: A | Discharge status: Home / Self Care | Payer: Medicaid Other | Source: Ambulatory Visit

## 2022-09-27 ENCOUNTER — Ambulatory Visit: Payer: Self-pay

## 2022-10-11 ENCOUNTER — Ambulatory Visit
Admission: RE | Admit: 2022-10-11 | Discharge: 2022-10-11 | Disposition: A | Discharge status: Home / Self Care | Payer: Medicaid Other | Source: Ambulatory Visit | Attending: Urgent Care | Admitting: Urgent Care

## 2022-10-11 ENCOUNTER — Ambulatory Visit (INDEPENDENT_AMBULATORY_CARE_PROVIDER_SITE_OTHER): Payer: Medicaid Other

## 2022-10-11 ENCOUNTER — Ambulatory Visit: Payer: Medicaid Other

## 2022-10-11 VITALS — BP 141/94 | HR 98 | Temp 98.9°F | Resp 16

## 2022-10-11 DIAGNOSIS — M25531 Pain in right wrist: Secondary | ICD-10-CM

## 2022-10-11 DIAGNOSIS — M19041 Primary osteoarthritis, right hand: Secondary | ICD-10-CM

## 2022-10-11 DIAGNOSIS — S60211A Contusion of right wrist, initial encounter: Secondary | ICD-10-CM | POA: Diagnosis not present

## 2022-10-11 MED ORDER — PREDNISONE 20 MG PO TABS: ORAL_TABLET | ORAL | 0 refills | Status: DC
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
## 2022-10-11 NOTE — ED Triage Notes (Addendum)
Pt states he injured right hand/wrist yesterday while trying to prevent self from falling-NAD-steady gait

## 2023-03-27 ENCOUNTER — Ambulatory Visit: Payer: Self-pay

## 2023-03-27 ENCOUNTER — Ambulatory Visit
Admission: RE | Admit: 2023-03-27 | Discharge: 2023-03-27 | Disposition: A | Discharge status: Home / Self Care | Payer: Medicaid Other | Source: Ambulatory Visit | Attending: Nurse Practitioner | Admitting: Nurse Practitioner

## 2023-03-27 VITALS — BP 138/93 | HR 61 | Temp 98.1°F | Resp 16

## 2023-03-27 DIAGNOSIS — J029 Acute pharyngitis, unspecified: Secondary | ICD-10-CM | POA: Diagnosis present

## 2023-03-27 DIAGNOSIS — Z20818 Contact with and (suspected) exposure to other bacterial communicable diseases: Secondary | ICD-10-CM | POA: Diagnosis present

## 2023-03-27 LAB — POCT RAPID STREP A (OFFICE): Rapid Strep A Screen: NEGATIVE

## 2023-03-27 MED ORDER — AMOXICILLIN 500 MG PO CAPS: 500.0000 mg | ORAL_CAPSULE | Freq: Two times a day (BID) | ORAL | 0 refills | Status: AC

## 2023-03-27 NOTE — ED Provider Notes (Signed)
[facility]    CSN: [id number] Arrival date & time: 03/27/23  1349      History   Chief Complaint Chief Complaint  Patient presents with   Sore Throat    Entered by patient    HPI Justin Phelps is a 42 y.o. male  presents for evaluation of URI symptoms for 2 days. Patient reports associated symptoms of sore throat, subjective fevers, dry cough. Denies N/V/D, ear pain, body aches, shortness of breath. Patient does not have a hx of asthma.  He does smoke.  Reports that his children have strep throat.  Pt has taken nothing OTC for symptoms. Pt has no other concerns at this time.    Sore Throat    History reviewed. No pertinent past medical history.  There are no problems to display for this patient.   History reviewed. No pertinent surgical history.     Home Medications    Prior to Admission medications   Medication Sig Start Date End Date Taking? Authorizing Provider  amoxicillin (AMOXIL) 500 MG capsule Take 1 capsule (500 mg total) by mouth 2 (two) times daily for 10 days. 03/27/23 04/06/23 Yes Radford Pax, NP  fluticasone (FLONASE) 50 MCG/ACT nasal spray Place 2 sprays into both nostrils daily. 07/01/22   Domenick Gong, MD  meclizine (ANTIVERT) 25 MG tablet Take 1 tablet (25 mg total) by mouth 3 (three) times daily as needed for dizziness. Patient not taking: Reported on 05/07/2022 06/26/21   Valinda Hoar, NP  naproxen (NAPROSYN) 500 MG tablet Take 1 tablet (500 mg total) by mouth 2 (two) times daily. 08/23/22   Raspet, Noberto Retort, PA-C  predniSONE (DELTASONE) 20 MG tablet Take 2 tablets daily with breakfast. 10/11/22   Wallis Bamberg, PA-C    Family History History reviewed. No pertinent family history.  Social History Social History   Tobacco Use   Smoking status: Some Days    Types: Cigarettes   Smokeless tobacco: Never  Vaping Use   Vaping Use: Never used  Substance Use Topics   Alcohol use: Yes    Comment: occ   Drug use: Yes    Types:  Marijuana     Allergies   Patient has no known allergies.   Review of Systems Review of Systems  Constitutional:  Positive for chills.  HENT:  Positive for sore throat.   Respiratory:  Positive for cough.      Physical Exam Triage Vital Signs ED Triage Vitals  Enc Vitals Group     BP 03/27/23 1400 (!) 138/93     Pulse Rate 03/27/23 1400 61     Resp 03/27/23 1400 16     Temp 03/27/23 1400 98.1 F (36.7 C)     Temp Source 03/27/23 1400 Oral     SpO2 03/27/23 1400 97 %     Weight --      Height --      Head Circumference --      Peak Flow --      Pain Score 03/27/23 1419 1     Pain Loc --      Pain Edu? --      Excl. in GC? --    No data found.  Updated Vital Signs BP (!) 138/93 (BP Location: Left Arm)   Pulse 61   Temp 98.1 F (36.7 C) (Oral)   Resp 16   SpO2 97%   Visual Acuity Right Eye Distance:   Left Eye Distance:   Bilateral Distance:  Right Eye Near:   Left Eye Near:    Bilateral Near:     Physical Exam Vitals and nursing note reviewed.  Constitutional:      General: He is not in acute distress.    Appearance: Normal appearance. He is not ill-appearing or toxic-appearing.  HENT:     Head: Normocephalic and atraumatic.     Right Ear: Tympanic membrane and ear canal normal.     Left Ear: Tympanic membrane and ear canal normal.     Nose: No congestion.     Mouth/Throat:     Mouth: Mucous membranes are moist.     Pharynx: Posterior oropharyngeal erythema present.  Eyes:     Pupils: Pupils are equal, round, and reactive to light.  Cardiovascular:     Rate and Rhythm: Normal rate and regular rhythm.     Heart sounds: Normal heart sounds.  Pulmonary:     Effort: Pulmonary effort is normal.     Breath sounds: Normal breath sounds.  Musculoskeletal:     Cervical back: Normal range of motion and neck supple.  Lymphadenopathy:     Cervical: No cervical adenopathy.  Skin:    General: Skin is warm and dry.  Neurological:     General: No  focal deficit present.     Mental Status: He is alert and oriented to person, place, and time.  Psychiatric:        Mood and Affect: Mood normal.        Behavior: Behavior normal.      UC Treatments / Results  Labs (all labs ordered are listed, but only abnormal results are displayed) Labs Reviewed  CULTURE, GROUP A STREP [facility])  POCT RAPID STREP A (OFFICE)    EKG   Radiology No results found.  Procedures Procedures (including critical care time)  Medications Ordered in UC Medications - No data to display  Initial Impression / Assessment and Plan / UC Course  I have reviewed the triage vital signs and the nursing notes.  Pertinent labs & imaging results that were available during my care of the patient were reviewed by me and considered in my medical decision making (see chart for details).     Negative rapid strep, will culture.  Given close exposure and symptoms start amoxicillin Salt water gargles and warm liquids Rest and fluids PCP follow-up if symptoms do not improve ER precautions reviewed and patient verbalized understanding Final Clinical Impressions(s) / UC Diagnoses   Final diagnoses:  Sore throat  Exposure to strep throat     Discharge Instructions      The clinic will contact you with results of the strep throat culture done today.  Start amoxicillin twice daily for 10 days given your exposure to strep throat Salt water gargles and warm liquids such as tea and honey Rest and fluids PCP follow-up if symptoms do not improve Please go to the ER for any worsening symptoms Hope you feel better soon!   ED Prescriptions     Medication Sig Dispense Auth. Provider   amoxicillin (AMOXIL) 500 MG capsule Take 1 capsule (500 mg total) by mouth 2 (two) times daily for 10 days. 20 capsule Radford Pax, NP      PDMP not reviewed this encounter.   Radford Pax, NP 03/27/23 1425

## 2023-03-27 NOTE — Discharge Instructions (Signed)
The clinic will contact you with results of the strep throat culture done today.  Start amoxicillin twice daily for 10 days given your exposure to strep throat Salt water gargles and warm liquids such as tea and honey Rest and fluids PCP follow-up if symptoms do not improve Please go to the ER for any worsening symptoms Hope you feel better soon!

## 2023-03-27 NOTE — ED Triage Notes (Signed)
Pt presents to UC w/ c/o sore throat, fever, dry cough x2 days. Pt's children have strep.

## 2023-03-30 LAB — CULTURE, GROUP A STREP (THRC)

## 2023-08-11 ENCOUNTER — Ambulatory Visit
Admission: EM | Admit: 2023-08-11 | Discharge: 2023-08-11 | Disposition: A | Discharge status: Home / Self Care | Payer: 59 | Attending: Family Medicine | Admitting: Family Medicine

## 2023-08-11 ENCOUNTER — Encounter: Payer: Self-pay | Admitting: Emergency Medicine

## 2023-08-11 DIAGNOSIS — J029 Acute pharyngitis, unspecified: Secondary | ICD-10-CM | POA: Diagnosis present

## 2023-08-11 LAB — POCT RAPID STREP A (OFFICE): Rapid Strep A Screen: NEGATIVE

## 2023-08-11 NOTE — ED Provider Notes (Signed)
[facility]    CSN: [id number] Arrival date & time: 08/11/23  0912      History   Chief Complaint Chief Complaint  Patient presents with   Sore Throat    HPI Justin Phelps is a 42 y.o. male.    Sore Throat  Patient is here for sore throat, headache, body aches.  He noted it yesterday am.  Burgess Estelle he had a slight temp, felt warm.  99.2.  +runny nose, congestion and cough that started later last night.  No n/v.  He did take motrin and theraflu.  His son had a virus.        History reviewed. No pertinent past medical history.  There are no problems to display for this patient.   History reviewed. No pertinent surgical history.     Home Medications    Prior to Admission medications   Medication Sig Start Date End Date Taking? Authorizing Provider  fluticasone (FLONASE) 50 MCG/ACT nasal spray Place 2 sprays into both nostrils daily. 07/01/22   Domenick Gong, MD  meclizine (ANTIVERT) 25 MG tablet Take 1 tablet (25 mg total) by mouth 3 (three) times daily as needed for dizziness. Patient not taking: Reported on 05/07/2022 06/26/21   Valinda Hoar, NP  naproxen (NAPROSYN) 500 MG tablet Take 1 tablet (500 mg total) by mouth 2 (two) times daily. 08/23/22   Raspet, Noberto Retort, PA-C  predniSONE (DELTASONE) 20 MG tablet Take 2 tablets daily with breakfast. 10/11/22   Wallis Bamberg, PA-C    Family History History reviewed. No pertinent family history.  Social History Social History   Tobacco Use   Smoking status: Some Days    Types: Cigarettes   Smokeless tobacco: Never  Vaping Use   Vaping status: Never Used  Substance Use Topics   Alcohol use: Yes    Comment: occ   Drug use: Yes    Types: Marijuana     Allergies   Patient has no known allergies.   Review of Systems Review of Systems  Constitutional:  Positive for fever.  HENT:  Positive for congestion, rhinorrhea and sore throat.   Respiratory:  Positive for cough.   Cardiovascular:  Negative.   Gastrointestinal: Negative.   Musculoskeletal: Negative.   Psychiatric/Behavioral: Negative.       Physical Exam Triage Vital Signs ED Triage Vitals  Encounter Vitals Group     BP 08/11/23 0920 (!) 151/94     Systolic BP Percentile --      Diastolic BP Percentile --      Pulse Rate 08/11/23 0920 82     Resp 08/11/23 0920 16     Temp 08/11/23 0920 98.1 F (36.7 C)     Temp Source 08/11/23 0920 Oral     SpO2 08/11/23 0920 98 %     Weight 08/11/23 0921 150 lb (68 kg)     Height 08/11/23 0921 5\' 10"  (1.778 m)     Head Circumference --      Peak Flow --      Pain Score 08/11/23 0921 8     Pain Loc --      Pain Education --      Exclude from Growth Chart --    No data found.  Updated Vital Signs BP (!) 151/94 (BP Location: Right Arm)   Pulse 82   Temp 98.1 F (36.7 C) (Oral)   Resp 16   Ht 5\' 10"  (1.778 m)   Wt 68 kg   SpO2 98%  BMI 21.52 kg/m   Visual Acuity Right Eye Distance:   Left Eye Distance:   Bilateral Distance:    Right Eye Near:   Left Eye Near:    Bilateral Near:     Physical Exam Constitutional:      Appearance: He is well-developed.  HENT:     Nose: Congestion and rhinorrhea present.     Mouth/Throat:     Mouth: Mucous membranes are moist.     Pharynx: Posterior oropharyngeal erythema present. No oropharyngeal exudate.  Cardiovascular:     Rate and Rhythm: Normal rate and regular rhythm.     Heart sounds: Normal heart sounds.  Musculoskeletal:     Cervical back: Normal range of motion and neck supple.  Skin:    General: Skin is warm.  Neurological:     General: No focal deficit present.     Mental Status: He is alert.  Psychiatric:        Mood and Affect: Mood normal.      UC Treatments / Results  Labs (all labs ordered are listed, but only abnormal results are displayed) Labs Reviewed  CULTURE, GROUP A STREP [facility])  POCT RAPID STREP A (OFFICE)    EKG   Radiology No results found.  Procedures Procedures  (including critical care time)  Medications Ordered in UC Medications - No data to display  Initial Impression / Assessment and Plan / UC Course  I have reviewed the triage vital signs and the nursing notes.  Pertinent labs & imaging results that were available during my care of the patient were reviewed by me and considered in my medical decision making (see chart for details).     Final Clinical Impressions(s) / UC Diagnoses   Final diagnoses:  Acute pharyngitis, unspecified etiology     Discharge Instructions      You were seen today for sore throat and upper respiratory symptoms.  Your rapid strep was negative, but will be sent to the lab for culture.  In the mean time your symptoms appear viral.  I recommend over the counter motrin/tylenol for pain.  I recommend warm salt water gargles, honey, tea.  Please return if not improving as expected.     ED Prescriptions   None    PDMP not reviewed this encounter.   Jannifer Franklin, MD 08/11/23 [phone]

## 2023-08-11 NOTE — Discharge Instructions (Addendum)
You were seen today for sore throat and upper respiratory symptoms.  Your rapid strep was negative, but will be sent to the lab for culture.  In the mean time your symptoms appear viral.  I recommend over the counter motrin/tylenol for pain.  I recommend warm salt water gargles, honey, tea.  Please return if not improving as expected.

## 2023-08-11 NOTE — ED Triage Notes (Signed)
Patient c/o sore throat, bodyaches and headache x 1 day.  Patient has taken Ibuprofen and Theraflu for sx's.

## 2023-08-14 LAB — CULTURE, GROUP A STREP (THRC)

## 2023-08-22 ENCOUNTER — Other Ambulatory Visit: Payer: Self-pay

## 2023-08-22 ENCOUNTER — Ambulatory Visit
Admission: EM | Admit: 2023-08-22 | Discharge: 2023-08-22 | Disposition: A | Discharge status: Home / Self Care | Payer: 59 | Attending: Internal Medicine | Admitting: Internal Medicine

## 2023-08-22 ENCOUNTER — Encounter: Payer: Self-pay | Admitting: *Deleted

## 2023-08-22 DIAGNOSIS — J069 Acute upper respiratory infection, unspecified: Secondary | ICD-10-CM

## 2023-08-22 MED ORDER — FLUTICASONE PROPIONATE 50 MCG/ACT NA SUSP: 1.0000 | Freq: Every day | NASAL | 0 refills | Status: DC

## 2023-08-22 MED ORDER — BENZONATATE 100 MG PO CAPS: 100.0000 mg | ORAL_CAPSULE | Freq: Three times a day (TID) | ORAL | 0 refills | Status: DC | PRN

## 2023-08-22 NOTE — ED Triage Notes (Signed)
Productive cough x 3 days with congestion. States his son has pneumonia

## 2023-08-22 NOTE — ED Provider Notes (Signed)
[facility]    CSN: [id number] Arrival date & time: 08/22/23  1124      History   Chief Complaint Chief Complaint  Patient presents with   Cough    HPI Justin Phelps is a 42 y.o. male.   Patient presents with cough and nasal congestion that started about 3 days ago.  Reports that his son was recently diagnosed with pneumonia.  Patient was also seen in urgent care on 10/21 for different symptoms and reports that those symptoms resolved, and these are new symptoms.  Denies any known fever at home.  Denies chest pain or shortness of breath.  Patient has not taken any medication for symptoms.  Denies history of asthma or COPD but patient does report that he smokes approximately 4 cigarettes a day.   Cough   No past medical history on file.  There are no problems to display for this patient.   No past surgical history on file.     Home Medications    Prior to Admission medications   Medication Sig Start Date End Date Taking? Authorizing Provider  benzonatate (TESSALON) 100 MG capsule Take 1 capsule (100 mg total) by mouth every 8 (eight) hours as needed for cough. 08/22/23  Yes Jamita Mckelvin, Rolly Salter E, FNP  fluticasone (FLONASE) 50 MCG/ACT nasal spray Place 1 spray into both nostrils daily. 08/22/23  Yes Paulino Cork, Acie Fredrickson, FNP  meclizine (ANTIVERT) 25 MG tablet Take 1 tablet (25 mg total) by mouth 3 (three) times daily as needed for dizziness. Patient not taking: Reported on 05/07/2022 06/26/21   Valinda Hoar, NP  naproxen (NAPROSYN) 500 MG tablet Take 1 tablet (500 mg total) by mouth 2 (two) times daily. 08/23/22   Raspet, Noberto Retort, PA-C  predniSONE (DELTASONE) 20 MG tablet Take 2 tablets daily with breakfast. 10/11/22   Wallis Bamberg, PA-C    Family History No family history on file.  Social History Social History   Tobacco Use   Smoking status: Some Days    Types: Cigarettes   Smokeless tobacco: Never  Vaping Use   Vaping status: Never Used  Substance Use Topics    Alcohol use: Yes    Comment: social   Drug use: Not Currently    Types: Marijuana     Allergies   Patient has no known allergies.   Review of Systems Review of Systems Per HPI  Physical Exam Triage Vital Signs ED Triage Vitals  Encounter Vitals Group     BP 08/22/23 1320 (!) 137/92     Systolic BP Percentile --      Diastolic BP Percentile --      Pulse Rate 08/22/23 1320 68     Resp 08/22/23 1320 16     Temp 08/22/23 1320 98.7 F (37.1 C)     Temp Source 08/22/23 1320 Oral     SpO2 08/22/23 1320 99 %     Weight --      Height --      Head Circumference --      Peak Flow --      Pain Score 08/22/23 1318 8     Pain Loc --      Pain Education --      Exclude from Growth Chart --    No data found.  Updated Vital Signs BP (!) 137/92 (BP Location: Left Arm)   Pulse 68   Temp 98.7 F (37.1 C) (Oral)   Resp 16   SpO2 99%   Visual  Acuity Right Eye Distance:   Left Eye Distance:   Bilateral Distance:    Right Eye Near:   Left Eye Near:    Bilateral Near:     Physical Exam Constitutional:      General: He is not in acute distress.    Appearance: Normal appearance. He is not toxic-appearing or diaphoretic.  HENT:     Head: Normocephalic and atraumatic.     Right Ear: Tympanic membrane and ear canal normal.     Left Ear: Tympanic membrane and ear canal normal.     Nose: Congestion present.     Mouth/Throat:     Mouth: Mucous membranes are moist.     Pharynx: No posterior oropharyngeal erythema.  Eyes:     Extraocular Movements: Extraocular movements intact.     Conjunctiva/sclera: Conjunctivae normal.     Pupils: Pupils are equal, round, and reactive to light.  Cardiovascular:     Rate and Rhythm: Normal rate and regular rhythm.     Pulses: Normal pulses.     Heart sounds: Normal heart sounds.  Pulmonary:     Effort: Pulmonary effort is normal. No respiratory distress.     Breath sounds: Normal breath sounds. No stridor. No wheezing, rhonchi or  rales.  Abdominal:     General: Abdomen is flat. Bowel sounds are normal.     Palpations: Abdomen is soft.  Musculoskeletal:        General: Normal range of motion.     Cervical back: Normal range of motion.  Skin:    General: Skin is warm and dry.  Neurological:     General: No focal deficit present.     Mental Status: He is alert and oriented to person, place, and time. Mental status is at baseline.  Psychiatric:        Mood and Affect: Mood normal.        Behavior: Behavior normal.      UC Treatments / Results  Labs (all labs ordered are listed, but only abnormal results are displayed) Labs Reviewed - No data to display  EKG   Radiology No results found.  Procedures Procedures (including critical care time)  Medications Ordered in UC Medications - No data to display  Initial Impression / Assessment and Plan / UC Course  I have reviewed the triage vital signs and the nursing notes.  Pertinent labs & imaging results that were available during my care of the patient were reviewed by me and considered in my medical decision making (see chart for details).     Patient presents with symptoms likely from a viral upper respiratory infection.  Do not suspect underlying cardiopulmonary process. Symptoms seem unlikely related to ACS, CHF or COPD exacerbations, pneumonia, pneumothorax. Patient is nontoxic appearing and not in need of emergent medical intervention.  I have a very low suspicion for pneumonia given vital signs are stable with no fever and there are no adventitious lung sounds on exam.  Therefore, chest imaging was deferred today.  Patient declined COVID testing.  No indication of antibiotic therapy noted on physical exam.  Recommended symptom control with medications, supportive care, fluids, rest.  Patient was sent Flonase and benzonatate for symptoms.  Return if symptoms fail to improve in 1-2 weeks or you develop shortness of breath, chest pain, severe headache.  Patient states understanding and is agreeable.  Discharged with PCP followup.  Final Clinical Impressions(s) / UC Diagnoses   Final diagnoses:  Viral upper respiratory tract infection with cough  Discharge Instructions      As we discussed, your symptoms appear viral in nature. I have prescribed you cough medication and nasal spray to help alleviate symptoms.  Ensure you are drinking plenty of fluids.  You may follow-up if any symptoms persist or worsen.    ED Prescriptions     Medication Sig Dispense Auth. Provider   fluticasone (FLONASE) 50 MCG/ACT nasal spray Place 1 spray into both nostrils daily. 16 g Kristena Wilhelmi, Rolly Salter E, [place]   benzonatate (TESSALON) 100 MG capsule Take 1 capsule (100 mg total) by mouth every 8 (eight) hours as needed for cough. 21 capsule Bixby, Acie Fredrickson, [place]      PDMP not reviewed this encounter.   Gustavus Bryant, [place] 08/22/23 1350

## 2023-08-22 NOTE — Discharge Instructions (Signed)
As we discussed, your symptoms appear viral in nature. I have prescribed you cough medication and nasal spray to help alleviate symptoms.  Ensure you are drinking plenty of fluids.  You may follow-up if any symptoms persist or worsen.

## 2023-09-24 ENCOUNTER — Ambulatory Visit
Admission: RE | Admit: 2023-09-24 | Discharge: 2023-09-24 | Disposition: A | Discharge status: Home / Self Care | Payer: 59 | Source: Ambulatory Visit | Attending: Family Medicine | Admitting: Family Medicine

## 2023-09-24 VITALS — BP 143/83 | HR 88 | Temp 97.8°F | Resp 18 | Ht 70.5 in | Wt 151.0 lb

## 2023-09-24 DIAGNOSIS — Z20822 Contact with and (suspected) exposure to covid-19: Secondary | ICD-10-CM | POA: Diagnosis not present

## 2023-09-24 DIAGNOSIS — J069 Acute upper respiratory infection, unspecified: Secondary | ICD-10-CM | POA: Diagnosis present

## 2023-09-24 LAB — SARS CORONAVIRUS 2 BY RT PCR: SARS Coronavirus 2 by RT PCR: NEGATIVE

## 2023-09-24 MED ORDER — PROMETHAZINE-DM 6.25-15 MG/5ML PO SYRP: 5.0000 mL | ORAL_SOLUTION | Freq: Four times a day (QID) | ORAL | 0 refills | Status: DC | PRN

## 2023-09-24 NOTE — Discharge Instructions (Signed)
You have been tested for COVID-19 today. °If your test returns positive, you will receive a phone call from [facility] regarding your results. °Negative test results are not called. °Both positive and negative results area always visible on MyChart. °If you do not have a MyChart account, sign up instructions are provided in your discharge papers. °Please do not hesitate to contact us should you have questions or concerns. ° °

## 2023-09-24 NOTE — ED Provider Notes (Signed)
  [facility]   [id number] 09/24/23 Arrival Time: 1157  ASSESSMENT & PLAN:  1. Exposure to COVID-19 virus   2. Viral URI with cough    Discussed typical duration of likely viral illness. COVID PCR testing sent. OTC symptom care as needed. Work note provided.  New Prescriptions   PROMETHAZINE-DEXTROMETHORPHAN (PROMETHAZINE-DM) 6.25-15 MG/5ML SYRUP    Take 5 mLs by mouth 4 (four) times daily as needed for cough.     Follow-up Information     [facility]).   Specialty: Urgent Care Why: As needed. Contact information: [address] [place] [address] [phone]                Reviewed expectations re: course of current medical issues. Questions answered. Outlined signs and symptoms indicating need for more acute intervention. Understanding verbalized. After Visit Summary given.   SUBJECTIVE: History from: Patient. Justin Phelps is a 42 y.o. male. Patient presents with body aches, chills and headache x 2-3 days. Exposed to covid at work. No treatment used.  Denies: fever and difficulty breathing. Normal PO intake without n/v/d.  OBJECTIVE:  Vitals:   09/24/23 1212 09/24/23 1215  BP:  (!) 143/83  Pulse:  88  Resp:  18  Temp:  97.8 F (36.6 C)  TempSrc:  Oral  SpO2:  98%  Weight: 68.5 kg   Height: 5' 10.5" (1.791 m)     General appearance: alert; no distress Eyes: PERRLA; EOMI; conjunctiva normal HENT: Man; AT; with nasal congestion Neck: supple  Lungs: speaks full sentences without difficulty; unlabored; ctab Extremities: no edema Skin: warm and dry Neurologic: normal gait Psychological: alert and cooperative; normal mood and affect  Labs:  Labs Reviewed  SARS CORONAVIRUS 2 BY RT PCR    Imaging: No results found.  No Known Allergies  History reviewed. No pertinent past medical history. Social History   Socioeconomic History   Marital status: Significant  Other    Spouse name: Not on file   Number of children: Not on file   Years of education: Not on file   Highest education level: Not on file  Occupational History   Not on file  Tobacco Use   Smoking status: Some Days    Types: Cigarettes   Smokeless tobacco: Never  Vaping Use   Vaping status: Never Used  Substance and Sexual Activity   Alcohol use: Yes    Comment: social   Drug use: Not Currently    Types: Marijuana   Sexual activity: Yes  Other Topics Concern   Not on file  Social History Narrative   Not on file   Social Determinants of Health   Financial Resource Strain: Not on file  Food Insecurity: Not on file  Transportation Needs: Not on file  Physical Activity: Not on file  Stress: Not on file (08/28/2023)  Social Connections: Not on file  Intimate Partner Violence: Not on file   History reviewed. No pertinent family history. History reviewed. No pertinent surgical history.   Mardella Layman, MD 09/24/23 [phone]

## 2023-09-24 NOTE — ED Triage Notes (Signed)
Patient presents with body aches, chills and headache x day 3. Exposed to covid at work. No treatment used.

## 2024-05-05 ENCOUNTER — Other Ambulatory Visit: Payer: Self-pay

## 2024-05-05 ENCOUNTER — Ambulatory Visit
Admission: RE | Admit: 2024-05-05 | Discharge: 2024-05-05 | Disposition: A | Discharge status: Home / Self Care | Payer: MEDICAID | Source: Ambulatory Visit | Attending: Nurse Practitioner | Admitting: Nurse Practitioner

## 2024-05-05 ENCOUNTER — Ambulatory Visit (INDEPENDENT_AMBULATORY_CARE_PROVIDER_SITE_OTHER): Payer: MEDICAID

## 2024-05-05 VITALS — BP 123/81 | HR 65 | Temp 98.1°F | Resp 16

## 2024-05-05 DIAGNOSIS — M25511 Pain in right shoulder: Secondary | ICD-10-CM

## 2024-05-05 MED ORDER — IBUPROFEN 800 MG PO TABS
800.0000 mg | ORAL_TABLET | Freq: Once | ORAL | Status: AC
Start: 2024-05-05 — End: 2024-05-05
  Administered 2024-05-05: 800 mg via ORAL

## 2024-05-05 NOTE — ED Provider Notes (Signed)
 [facility]    CSN: [id number] Arrival date & time: 05/05/24  1037      History   Chief Complaint Chief Complaint  Patient presents with   Shoulder Pain    Entered by patient    HPI Justin Phelps is a 43 y.o. male.   Discussed the use of AI scribe software for clinical note transcription with the patient, who gave verbal consent to proceed.   Patient presents with right shoulder pain that began on Monday afternoon. The onset occurred when the patient picked up his two year old son and felt a pop in the right shoulder. The pain persisted until earlier this morning when, while in bed, the patient rolled over or pushed up and felt another pop, after which the pain started to ease. Currently, the patient experiences intermittent sharp pains with certain movements. He denies any previous issues with this shoulder. The patient has not taken any medication for the pain. He mentions having a 75-year-old son, but states that lifting him has never caused shoulder issues before. The patient denies any pain in the neck.  The following portions of the patient's history were reviewed and updated as appropriate: allergies, current medications, past family history, past medical history, past social history, past surgical history, and problem list.    History reviewed. No pertinent past medical history.  There are no active problems to display for this patient.   History reviewed. No pertinent surgical history.     Home Medications    Prior to Admission medications   Not on File    Family History History reviewed. No pertinent family history.  Social History Social History   Tobacco Use   Smoking status: Some Days    Types: Cigarettes   Smokeless tobacco: Never  Vaping Use   Vaping status: Never Used  Substance Use Topics   Alcohol use: Yes    Comment: social   Drug use: Not Currently    Types: Marijuana     Allergies   Patient has no known  allergies.   Review of Systems Review of Systems  Musculoskeletal:  Negative for neck pain.       Right shoulder pain    Neurological:  Negative for weakness and numbness.  All other systems reviewed and are negative.    Physical Exam Triage Vital Signs ED Triage Vitals  Encounter Vitals Group     BP 05/05/24 1047 123/81     Girls Systolic BP Percentile --      Girls Diastolic BP Percentile --      Boys Systolic BP Percentile --      Boys Diastolic BP Percentile --      Pulse Rate 05/05/24 1047 65     Resp 05/05/24 1047 16     Temp 05/05/24 1047 98.1 F (36.7 C)     Temp Source 05/05/24 1047 Oral     SpO2 05/05/24 1047 97 %     Weight --      Height --      Head Circumference --      Peak Flow --      Pain Score 05/05/24 1046 6     Pain Loc --      Pain Education --      Exclude from Growth Chart --    No data found.  Updated Vital Signs BP 123/81   Pulse 65   Temp 98.1 F (36.7 C) (Oral)   Resp 16   SpO2 97%  Visual Acuity Right Eye Distance:   Left Eye Distance:   Bilateral Distance:    Right Eye Near:   Left Eye Near:    Bilateral Near:     Physical Exam Vitals reviewed.  Constitutional:      General: He is awake. He is not in acute distress.    Appearance: Normal appearance. He is well-developed. He is not ill-appearing, toxic-appearing or diaphoretic.  HENT:     Head: Normocephalic.     Right Ear: Hearing normal.     Left Ear: Hearing normal.     Nose: Nose normal.     Mouth/Throat:     Mouth: Mucous membranes are moist.  Eyes:     General: Vision grossly intact.     Conjunctiva/sclera: Conjunctivae normal.  Cardiovascular:     Rate and Rhythm: Normal rate and regular rhythm.     Heart sounds: Normal heart sounds.  Pulmonary:     Effort: Pulmonary effort is normal.     Breath sounds: Normal breath sounds and air entry.  Musculoskeletal:        General: Normal range of motion.     Right shoulder: Tenderness present. No swelling,  deformity, effusion or crepitus. Normal range of motion. Normal strength.     Cervical back: Full passive range of motion without pain, normal range of motion and neck supple. No rigidity or crepitus. No pain with movement, spinous process tenderness or muscular tenderness. Normal range of motion.  Skin:    General: Skin is warm and dry.  Neurological:     General: No focal deficit present.     Mental Status: He is alert and oriented to person, place, and time.     Sensory: Sensation is intact. No sensory deficit.     Motor: Motor function is intact. No weakness.  Psychiatric:        Speech: Speech normal.        Behavior: Behavior is cooperative.      UC Treatments / Results  Labs (all labs ordered are listed, but only abnormal results are displayed) Labs Reviewed - No data to display  EKG   Radiology No results found.  Procedures Procedures (including critical care time)  Medications Ordered in UC Medications  ibuprofen  (ADVIL ) tablet 800 mg (800 mg Oral Given 05/05/24 1055)    Initial Impression / Assessment and Plan / UC Course  I have reviewed the triage vital signs and the nursing notes.  Pertinent labs & imaging results that were available during my care of the patient were reviewed by me and considered in my medical decision making (see chart for details).     Patient presents with acute right shoulder pain that began after feeling a pop while lifting his 87-year-old son on Monday. A second pop was felt this morning during repositioning in bed, which led to partial relief of the pain. He currently experiences sharp pain with certain shoulder movements but denies any prior shoulder problems or recent heavy lifting aside from child care. No neck pain or neurological symptoms are reported. The mechanism of injury and clinical presentation are consistent with a possible muscle strain or minor soft tissue injury. An X-ray of the right shoulder was obtained to evaluate  for bony injury. Ibuprofen  800 mg was given in clinic for pain and inflammation. Plan is to reassess following imaging results. Patient was advised to avoid heavy lifting or overhead movements and follow up with PCP or return to clinic if symptoms persist or worsen,  or if imaging reveals abnormal findings. Seek emergency care for sudden loss of mobility, numbness, or severe swelling.  Today's evaluation has revealed no signs of a dangerous process. Discussed diagnosis with patient and/or guardian. Patient and/or guardian aware of their diagnosis, possible red flag symptoms to watch out for and need for close follow up. Patient and/or guardian understands verbal and written discharge instructions. Patient and/or guardian comfortable with plan and disposition.  Patient and/or guardian has a clear mental status at this time, good insight into illness (after discussion and teaching) and has clear judgment to make decisions regarding their care  Documentation was completed with the aid of voice recognition software. Transcription may contain typographical errors. Final Clinical Impressions(s) / UC Diagnoses   Final diagnoses:  Acute pain of right shoulder     Discharge Instructions      You have been evaluated for right shoulder pain that began after feeling a "pop" while lifting your child and improved slightly after a second "pop" occurred during movement in bed. This may be due to a minor strain or soft tissue injury. An X-ray was performed today to rule out any bone injury, and results will be reviewed once available. You were given ibuprofen  800 mg in clinic for pain and inflammation. You may continue using over-the-counter ibuprofen  or acetaminophen as needed for discomfort, following the dosage instructions on the label. Apply ice to the shoulder for 15 to 20 minutes several times a day to reduce swelling and pain. Avoid heavy lifting, overhead movements, or activities that worsen your symptoms.  Rest the arm as much as possible and consider using a sling if it helps reduce strain. Follow up with an orthopedic specialist if your shoulder pain limits your range of motion, or if the X-ray reveals any concerning findings. Seek emergency care if you experience sudden weakness, numbness, swelling, or inability to move the arm.      ED Prescriptions   None    PDMP not reviewed this encounter.   Iola Lukes, [place] 05/05/24 1122

## 2024-05-05 NOTE — Discharge Instructions (Addendum)
 You have been evaluated for right shoulder pain that began after feeling a "pop" while lifting your child and improved slightly after a second "pop" occurred during movement in bed. This may be due to a minor strain or soft tissue injury. An X-ray was performed today to rule out any bone injury, and results will be reviewed once available. You were given ibuprofen  800 mg in clinic for pain and inflammation. You may continue using over-the-counter ibuprofen  or acetaminophen as needed for discomfort, following the dosage instructions on the label. Apply ice to the shoulder for 15 to 20 minutes several times a day to reduce swelling and pain. Avoid heavy lifting, overhead movements, or activities that worsen your symptoms. Rest the arm as much as possible and consider using a sling if it helps reduce strain. Follow up with an orthopedic specialist if your shoulder pain limits your range of motion, or if the X-ray reveals any concerning findings. Seek emergency care if you experience sudden weakness, numbness, swelling, or inability to move the arm.

## 2024-05-05 NOTE — ED Triage Notes (Signed)
 Pt c/o right shoulder painx2d. Pt states was holding son and felt a pop in right shoulder 2 days ago. Pt has full ROM of shoulder, but states it's painful when his raises his arm. Pt has 1+ right radial pulse, warm to touch.

## 2024-07-01 ENCOUNTER — Ambulatory Visit: Payer: Self-pay

## 2024-11-16 ENCOUNTER — Ambulatory Visit
Admission: RE | Admit: 2024-11-16 | Discharge: 2024-11-16 | Disposition: A | Discharge status: Home / Self Care | Payer: MEDICAID | Source: Ambulatory Visit | Attending: Family Medicine | Admitting: Family Medicine

## 2024-11-16 VITALS — BP 141/93 | HR 71 | Temp 97.8°F | Resp 16

## 2024-11-16 DIAGNOSIS — R112 Nausea with vomiting, unspecified: Secondary | ICD-10-CM

## 2024-11-16 DIAGNOSIS — R197 Diarrhea, unspecified: Secondary | ICD-10-CM | POA: Diagnosis not present

## 2024-11-16 DIAGNOSIS — A084 Viral intestinal infection, unspecified: Secondary | ICD-10-CM | POA: Diagnosis not present

## 2024-11-16 MED ORDER — LOPERAMIDE HCL 2 MG PO CAPS: 2.0000 mg | ORAL_CAPSULE | Freq: Two times a day (BID) | ORAL | 0 refills | Status: AC | PRN

## 2024-11-16 MED ORDER — ONDANSETRON 8 MG PO TBDP: 8.0000 mg | ORAL_TABLET | Freq: Three times a day (TID) | ORAL | 0 refills | Status: AC | PRN

## 2024-11-16 NOTE — Discharge Instructions (Signed)

## 2024-11-16 NOTE — ED Triage Notes (Signed)
 Pt c/o abd pain, n/v/d started ~10am-no meds PTA-NAD-steady gait

## 2024-11-16 NOTE — ED Provider Notes (Signed)
 [facility]  Note:  This document was prepared using Conservation officer, historic buildings and may include unintentional dictation errors.  MRN: [id number] DOB: 12/02/1980  Subjective:   Justin Phelps is a 44 y.o. male presenting for acute onset since this morning of nausea, vomiting, diarrhea, belly pain.  Patient has been out in public and has been eating out as well.  Has occasional alcohol use.  Smokes marijuana multiple times daily.  No fever, bloody stools, recent antibiotic use, hospitalizations or long distance travel.  Has not eaten raw foods, drank unfiltered water.  No history of GI disorders including Crohn's, IBS, ulcerative colitis.   No current outpatient medications  Allergies[1]  History reviewed. No pertinent past medical history.   History reviewed. No pertinent surgical history.  No family history on file.  Social History   Occupational History   Not on file  Tobacco Use   Smoking status: Some Days    Types: Cigarettes   Smokeless tobacco: Never  Vaping Use   Vaping status: Never Used  Substance and Sexual Activity   Alcohol use: Yes    Comment: occ   Drug use: Yes    Types: Marijuana   Sexual activity: Yes     ROS   Objective:   Vitals: BP (!) 141/93 (BP Location: Right Arm)   Pulse 71   Temp 97.8 F (36.6 C) (Oral)   Resp 16   SpO2 98%   Physical Exam Constitutional:      General: He is not in acute distress.    Appearance: Normal appearance. He is well-developed and normal weight. He is not ill-appearing, toxic-appearing or diaphoretic.  HENT:     Head: Normocephalic and atraumatic.     Right Ear: External ear normal.     Left Ear: External ear normal.     Nose: Nose normal.     Mouth/Throat:     Mouth: Mucous membranes are moist.     Pharynx: Oropharynx is clear.  Eyes:     General: No scleral icterus.       Right eye: No discharge.        Left eye: No discharge.     Extraocular Movements: Extraocular  movements intact.     Conjunctiva/sclera: Conjunctivae normal.  Cardiovascular:     Rate and Rhythm: Normal rate and regular rhythm.     Heart sounds: Normal heart sounds. No murmur heard.    No friction rub. No gallop.  Pulmonary:     Effort: Pulmonary effort is normal. No respiratory distress.     Breath sounds: Normal breath sounds. No stridor. No wheezing, rhonchi or rales.  Abdominal:     General: Bowel sounds are normal. There is no distension.     Palpations: Abdomen is soft. There is no mass.     Tenderness: There is no abdominal tenderness. There is no right CVA tenderness, left CVA tenderness, guarding or rebound.  Musculoskeletal:     Cervical back: Normal range of motion.  Skin:    General: Skin is warm and dry.  Neurological:     Mental Status: He is alert and oriented to person, place, and time.  Psychiatric:        Mood and Affect: Mood normal.        Behavior: Behavior normal.        Thought Content: Thought content normal.        Judgment: Judgment normal.     Assessment and Plan :  PDMP not reviewed this encounter.  1. Viral gastroenteritis   2. Nausea vomiting and diarrhea      No signs of an acute abdomen. Will manage for viral gastroenteritis with supportive care.  Rx for Zofran  for nausea and vomiting, Imodium  for diarrhea.  Patient is to push fluids and eat light meals including soups and soft foods. Counseled patient on potential for adverse effects with medications prescribed/recommended today, ER and return-to-clinic precautions discussed, patient verbalized understanding.     [1] No Known Allergies    Christopher Savannah, PA-C 11/16/24 1726  "
